# Patient Record
Sex: Female | Born: 1965 | Race: Black or African American | Hispanic: No | Marital: Married | Smoking: Never smoker
Health system: Southern US, Community
[De-identification: ages and names within clinical notes are randomized; demographics above are authoritative.]

## PROBLEM LIST (undated history)

## (undated) ENCOUNTER — Ambulatory Visit: Admission: EM | Payer: BC Managed Care – PPO | Source: Home / Self Care

## (undated) DIAGNOSIS — D649 Anemia, unspecified: Secondary | ICD-10-CM

---

## 1986-12-29 HISTORY — PX: EYE SURGERY: SHX253

## 1986-12-29 HISTORY — PX: SMALL BOWEL REPAIR: SHX6447

## 1986-12-29 HISTORY — PX: SPLENECTOMY: SUR1306

## 2018-10-11 ENCOUNTER — Emergency Department: Payer: BLUE CROSS/BLUE SHIELD

## 2018-10-11 ENCOUNTER — Other Ambulatory Visit: Payer: Self-pay

## 2018-10-11 ENCOUNTER — Encounter: Payer: Self-pay | Admitting: Intensive Care

## 2018-10-11 ENCOUNTER — Emergency Department
Admission: EM | Admit: 2018-10-11 | Discharge: 2018-10-11 | Disposition: A | Discharge status: Home / Self Care | Payer: BLUE CROSS/BLUE SHIELD | Attending: Student in an Organized Health Care Education/Training Program | Admitting: Student in an Organized Health Care Education/Training Program

## 2018-10-11 DIAGNOSIS — R1011 Right upper quadrant pain: Secondary | ICD-10-CM

## 2018-10-11 DIAGNOSIS — K8020 Calculus of gallbladder without cholecystitis without obstruction: Secondary | ICD-10-CM | POA: Diagnosis not present

## 2018-10-11 LAB — CBC
HCT: 40.3 % (ref 36.0–46.0)
HEMOGLOBIN: 13.1 g/dL (ref 12.0–15.0)
MCH: 27.9 pg (ref 26.0–34.0)
MCHC: 32.5 g/dL (ref 30.0–36.0)
MCV: 85.9 fL (ref 80.0–100.0)
Platelets: 410 10*3/uL — ABNORMAL HIGH (ref 150–400)
RBC: 4.69 MIL/uL (ref 3.87–5.11)
RDW: 15.4 % (ref 11.5–15.5)
WBC: 7.1 10*3/uL (ref 4.0–10.5)
nRBC: 0 % (ref 0.0–0.2)

## 2018-10-11 LAB — URINALYSIS, COMPLETE (UACMP) WITH MICROSCOPIC
Bilirubin Urine: NEGATIVE
Glucose, UA: NEGATIVE mg/dL
Ketones, ur: NEGATIVE mg/dL
Leukocytes, UA: NEGATIVE
Nitrite: NEGATIVE
PROTEIN: NEGATIVE mg/dL
Specific Gravity, Urine: 1.012 (ref 1.005–1.030)
pH: 5 (ref 5.0–8.0)

## 2018-10-11 LAB — COMPREHENSIVE METABOLIC PANEL
ALBUMIN: 4 g/dL (ref 3.5–5.0)
ALT: 17 U/L (ref 0–44)
ANION GAP: 11 (ref 5–15)
AST: 16 U/L (ref 15–41)
Alkaline Phosphatase: 79 U/L (ref 38–126)
BUN: 13 mg/dL (ref 6–20)
CHLORIDE: 105 mmol/L (ref 98–111)
CO2: 25 mmol/L (ref 22–32)
Calcium: 8.9 mg/dL (ref 8.9–10.3)
Creatinine, Ser: 0.69 mg/dL (ref 0.44–1.00)
GFR calc Af Amer: >60 mL/min (ref >60)
GFR calc non Af Amer: >60 mL/min (ref >60)
GLUCOSE: 86 mg/dL (ref 70–99)
POTASSIUM: 4.2 mmol/L (ref 3.5–5.1)
SODIUM: 141 mmol/L (ref 135–145)
Total Bilirubin: 0.6 mg/dL (ref 0.3–1.2)
Total Protein: 8 g/dL (ref 6.5–8.1)

## 2018-10-11 LAB — LIPASE, BLOOD: LIPASE: 32 U/L (ref 11–51)

## 2018-10-11 MED ORDER — KETOROLAC TROMETHAMINE 30 MG/ML IJ SOLN
15.0000 mg | Freq: Once | INTRAMUSCULAR | Status: AC
  Administered 2018-10-11: 15 mg via INTRAVENOUS
  Filled 2018-10-11: qty 1

## 2018-10-11 MED ORDER — IOPAMIDOL (ISOVUE-300) INJECTION 61%
100.0000 mL | Freq: Once | INTRAVENOUS | Status: AC | PRN
  Administered 2018-10-11: 100 mL via INTRAVENOUS
  Filled 2018-10-11: qty 100

## 2018-10-11 MED ORDER — PROCHLORPERAZINE MALEATE 10 MG PO TABS: 10.0000 mg | ORAL_TABLET | Freq: Four times a day (QID) | ORAL | 0 refills | Status: AC | PRN

## 2018-10-11 MED ORDER — HYDROCODONE-ACETAMINOPHEN 5-325 MG PO TABS: 1.0000 | ORAL_TABLET | ORAL | 0 refills | Status: DC | PRN
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
## 2018-10-11 NOTE — Discharge Instructions (Signed)

## 2018-10-11 NOTE — ED Triage Notes (Signed)
Patient presents with abd pain that started last night. Tender to touch. Denies N/V/D. Reports pain with ambulation

## 2018-10-11 NOTE — ED Notes (Signed)
Pt given ginger ale and graham crackers for PO challenge at this time
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
## 2018-10-11 NOTE — ED Notes (Signed)
Says right upper abdominal pain since yesterday.  Did not get any better today, so she came to be seen.  Says she tried laxative due to bloating. In nad.

## 2018-10-14 ENCOUNTER — Ambulatory Visit: Payer: Self-pay | Admitting: Surgery
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
## 2018-10-21 ENCOUNTER — Inpatient Hospital Stay: Admission: RE | Admit: 2018-10-21 | Payer: Self-pay | Source: Ambulatory Visit

## 2018-10-22 ENCOUNTER — Inpatient Hospital Stay: Admission: RE | Admit: 2018-10-22 | Payer: Self-pay | Source: Ambulatory Visit

## 2018-10-26 ENCOUNTER — Encounter
Admission: RE | Admit: 2018-10-26 | Discharge: 2018-10-26 | Disposition: A | Discharge status: Home / Self Care | Payer: Self-pay | Source: Ambulatory Visit | Attending: Surgery | Admitting: Surgery

## 2018-10-26 ENCOUNTER — Other Ambulatory Visit: Payer: Self-pay

## 2018-10-26 HISTORY — DX: Anemia, unspecified: D64.9

## 2018-10-26 NOTE — Patient Instructions (Signed)
Your procedure is scheduled on: 10-28-18  Report to Same Day Surgery 2nd floor medical mall [facility] Entrance-take elevator on left to 2nd floor.  Check in with surgery information desk.) To find out your arrival time please call [phone] between 1PM - 3PM on 10-27-18  Remember: Instructions that are not followed completely may result in serious medical risk, up to and including death, or upon the discretion of your surgeon and anesthesiologist your surgery may need to be rescheduled.    _x___ 1. Do not eat food after midnight the night before your procedure. You may drink clear liquids up to 2 hours before you are scheduled to arrive at the hospital for your procedure.  Do not drink clear liquids within 2 hours of your scheduled arrival to the hospital.  Clear liquids include  --Water or Apple juice without pulp  --Clear carbohydrate beverage such as ClearFast or Gatorade  --Black Coffee or Clear Tea (No milk, no creamers, do not add anything to the coffee or Tea   ____Ensure clear carbohydrate drink on the way to the hospital for bariatric patients  ____Ensure clear carbohydrate drink 3 hours before surgery for Dr Rutherford Nail patients if physician instructed.   No gum chewing or hard candies.     __x__ 2. No Alcohol for 24 hours before or after surgery.   __x__3. No Smoking or e-cigarettes for 24 prior to surgery.  Do not use any chewable tobacco products for at least 6 hour prior to surgery   ____  4. Bring all medications with you on the day of surgery if instructed.    __x__ 5. Notify your doctor if there is any change in your medical condition     (cold, fever, infections).    x___6. On the morning of surgery brush your teeth with toothpaste and water.  You may rinse your mouth with mouth wash if you wish.  Do not swallow any toothpaste or mouthwash.   Do not wear jewelry, make-up, hairpins, clips or nail polish.  Do not wear lotions, powders, or perfumes. You may wear  deodorant.  Do not shave 48 hours prior to surgery. Men may shave face and neck.  Do not bring valuables to the hospital.    [facility] is not responsible for any belongings or valuables.               Contacts, dentures or bridgework may not be worn into surgery.  Leave your suitcase in the car. After surgery it may be brought to your room.  For patients admitted to the hospital, discharge time is determined by your treatment team.  _  Patients discharged the day of surgery will not be allowed to drive home.  You will need someone to drive you home and stay with you the night of your procedure.    Please read over the following fact sheets that you were given:   [facility] Preparing for Surgery   ____ Take anti-hypertensive listed below, cardiac, seizure, asthma, anti-reflux and psychiatric medicines. These include:  1. NONE  2.  3.  4.  5.  6.  ____Fleets enema or Magnesium Citrate as directed.   ____ Use CHG Soap or sage wipes as directed on instruction sheet   ____ Use inhalers on the day of surgery and bring to hospital day of surgery  ____ Stop Metformin and Janumet 2 days prior to surgery.    ____ Take 1/2 of usual insulin dose the night before surgery and none  on the morning surgery.   ____ Follow recommendations from Cardiologist, Pulmonologist or PCP regarding stopping Aspirin, Coumadin, Plavix ,Eliquis, Effient, or Pradaxa, and Pletal.  X____Stop Anti-inflammatories such as Advil, Aleve, Ibuprofen, Motrin, Naproxen, Naprosyn, Goodies powders or aspirin products NOW-OK to take Tylenol    ____ Stop supplements until after surgery.     ____ Bring C-Pap to the hospital.

## 2018-10-27 MED ORDER — CEFAZOLIN SODIUM-DEXTROSE 2-4 GM/100ML-% IV SOLN
2.0000 g | INTRAVENOUS | Status: AC
  Administered 2018-10-28: 2 g via INTRAVENOUS

## 2018-10-28 ENCOUNTER — Ambulatory Visit
Admission: RE | Admit: 2018-10-28 | Discharge: 2018-10-28 | Disposition: A | Discharge status: Home / Self Care | Payer: BLUE CROSS/BLUE SHIELD | Source: Ambulatory Visit | Attending: Surgery | Admitting: Surgery

## 2018-10-28 ENCOUNTER — Encounter
Admission: RE | Disposition: A | Discharge status: Home / Self Care | Payer: Self-pay | Source: Ambulatory Visit | Attending: Surgery

## 2018-10-28 ENCOUNTER — Ambulatory Visit: Payer: BLUE CROSS/BLUE SHIELD | Admitting: Certified Registered"

## 2018-10-28 ENCOUNTER — Encounter: Payer: Self-pay | Admitting: *Deleted

## 2018-10-28 DIAGNOSIS — Z9081 Acquired absence of spleen: Secondary | ICD-10-CM | POA: Diagnosis not present

## 2018-10-28 DIAGNOSIS — K760 Fatty (change of) liver, not elsewhere classified: Secondary | ICD-10-CM | POA: Insufficient documentation

## 2018-10-28 DIAGNOSIS — K8010 Calculus of gallbladder with chronic cholecystitis without obstruction: Secondary | ICD-10-CM | POA: Insufficient documentation

## 2018-10-28 DIAGNOSIS — K429 Umbilical hernia without obstruction or gangrene: Secondary | ICD-10-CM | POA: Diagnosis not present

## 2018-10-28 DIAGNOSIS — K810 Acute cholecystitis: Secondary | ICD-10-CM

## 2018-10-28 HISTORY — PX: CHOLECYSTECTOMY: SHX55

## 2018-10-28 SURGERY — LAPAROSCOPIC CHOLECYSTECTOMY
Anesthesia: General

## 2018-10-28 MED ORDER — CEFAZOLIN SODIUM-DEXTROSE 2-4 GM/100ML-% IV SOLN
INTRAVENOUS | Status: AC
  Filled 2018-10-28: qty 100

## 2018-10-28 MED ORDER — IBUPROFEN 800 MG PO TABS: 800.0000 mg | ORAL_TABLET | Freq: Three times a day (TID) | ORAL | 0 refills | Status: AC | PRN

## 2018-10-28 MED ORDER — ACETAMINOPHEN 500 MG PO TABS
ORAL_TABLET | ORAL | Status: AC
  Administered 2018-10-28: 1000 mg via ORAL
  Filled 2018-10-28: qty 2

## 2018-10-28 MED ORDER — MIDAZOLAM HCL 2 MG/2ML IJ SOLN
INTRAMUSCULAR | Status: AC
  Filled 2018-10-28: qty 2

## 2018-10-28 MED ORDER — DEXAMETHASONE SODIUM PHOSPHATE 10 MG/ML IJ SOLN
INTRAMUSCULAR | Status: AC
  Filled 2018-10-28: qty 1

## 2018-10-28 MED ORDER — SUGAMMADEX SODIUM 200 MG/2ML IV SOLN
INTRAVENOUS | Status: DC | PRN
  Administered 2018-10-28: 165 mg via INTRAVENOUS

## 2018-10-28 MED ORDER — BUPIVACAINE HCL (PF) 0.5 % IJ SOLN
INTRAMUSCULAR | Status: AC
  Filled 2018-10-28: qty 30

## 2018-10-28 MED ORDER — LIDOCAINE-EPINEPHRINE (PF) 1 %-1:200000 IJ SOLN
INTRAMUSCULAR | Status: DC | PRN
  Administered 2018-10-28: 28 mL

## 2018-10-28 MED ORDER — PROPOFOL 10 MG/ML IV BOLUS
INTRAVENOUS | Status: AC
  Filled 2018-10-28: qty 20

## 2018-10-28 MED ORDER — FAMOTIDINE 20 MG PO TABS
ORAL_TABLET | ORAL | Status: AC
  Administered 2018-10-28: 20 mg via ORAL
  Filled 2018-10-28: qty 1

## 2018-10-28 MED ORDER — CELECOXIB 200 MG PO CAPS
200.0000 mg | ORAL_CAPSULE | ORAL | Status: AC
  Administered 2018-10-28: 200 mg via ORAL

## 2018-10-28 MED ORDER — ROCURONIUM BROMIDE 50 MG/5ML IV SOLN
INTRAVENOUS | Status: AC
  Filled 2018-10-28: qty 1

## 2018-10-28 MED ORDER — SEVOFLURANE IN SOLN
RESPIRATORY_TRACT | Status: AC
  Filled 2018-10-28: qty 250

## 2018-10-28 MED ORDER — PROPOFOL 10 MG/ML IV BOLUS
INTRAVENOUS | Status: DC | PRN
  Administered 2018-10-28: 160 mg via INTRAVENOUS

## 2018-10-28 MED ORDER — FENTANYL CITRATE (PF) 100 MCG/2ML IJ SOLN
INTRAMUSCULAR | Status: AC
  Filled 2018-10-28: qty 2

## 2018-10-28 MED ORDER — LIDOCAINE HCL (PF) 2 % IJ SOLN
INTRAMUSCULAR | Status: AC
  Filled 2018-10-28: qty 10

## 2018-10-28 MED ORDER — CELECOXIB 200 MG PO CAPS
ORAL_CAPSULE | ORAL | Status: AC
  Administered 2018-10-28: 200 mg via ORAL
  Filled 2018-10-28: qty 1

## 2018-10-28 MED ORDER — FAMOTIDINE 20 MG PO TABS
20.0000 mg | ORAL_TABLET | Freq: Once | ORAL | Status: AC
  Administered 2018-10-28: 20 mg via ORAL

## 2018-10-28 MED ORDER — GABAPENTIN 300 MG PO CAPS
ORAL_CAPSULE | ORAL | Status: AC
  Administered 2018-10-28: 300 mg via ORAL
  Filled 2018-10-28: qty 1

## 2018-10-28 MED ORDER — FENTANYL CITRATE (PF) 100 MCG/2ML IJ SOLN
INTRAMUSCULAR | Status: DC | PRN
  Administered 2018-10-28: 50 ug via INTRAVENOUS
  Administered 2018-10-28: 100 ug via INTRAVENOUS

## 2018-10-28 MED ORDER — CHLORHEXIDINE GLUCONATE CLOTH 2 % EX PADS
6.0000 | MEDICATED_PAD | Freq: Once | CUTANEOUS | Status: AC
  Administered 2018-10-28: 6 via TOPICAL

## 2018-10-28 MED ORDER — FENTANYL CITRATE (PF) 100 MCG/2ML IJ SOLN
25.0000 ug | INTRAMUSCULAR | Status: DC | PRN
  Administered 2018-10-28 (×4): 25 ug via INTRAVENOUS

## 2018-10-28 MED ORDER — LIDOCAINE HCL (CARDIAC) PF 100 MG/5ML IV SOSY
PREFILLED_SYRINGE | INTRAVENOUS | Status: DC | PRN
  Administered 2018-10-28: 100 mg via INTRAVENOUS

## 2018-10-28 MED ORDER — HYDROCODONE-ACETAMINOPHEN 5-325 MG PO TABS
ORAL_TABLET | ORAL | Status: AC
  Filled 2018-10-28: qty 1

## 2018-10-28 MED ORDER — ONDANSETRON HCL 4 MG/2ML IJ SOLN
INTRAMUSCULAR | Status: AC
  Filled 2018-10-28: qty 2

## 2018-10-28 MED ORDER — LACTATED RINGERS IV SOLN
INTRAVENOUS | Status: DC
  Administered 2018-10-28: 50 mL/h via INTRAVENOUS

## 2018-10-28 MED ORDER — ONDANSETRON HCL 4 MG/2ML IJ SOLN
INTRAMUSCULAR | Status: DC | PRN
  Administered 2018-10-28: 4 mg via INTRAVENOUS

## 2018-10-28 MED ORDER — PHENYLEPHRINE HCL 10 MG/ML IJ SOLN
INTRAMUSCULAR | Status: AC
  Filled 2018-10-28: qty 1

## 2018-10-28 MED ORDER — PHENYLEPHRINE HCL 10 MG/ML IJ SOLN
INTRAMUSCULAR | Status: DC | PRN
  Administered 2018-10-28 (×4): 100 ug via INTRAVENOUS

## 2018-10-28 MED ORDER — APAP 325 MG PO TABS: 625.0000 mg | ORAL_TABLET | Freq: Three times a day (TID) | ORAL | 0 refills | Status: AC | PRN

## 2018-10-28 MED ORDER — DEXAMETHASONE SODIUM PHOSPHATE 10 MG/ML IJ SOLN
INTRAMUSCULAR | Status: DC | PRN
  Administered 2018-10-28: 8 mg via INTRAVENOUS

## 2018-10-28 MED ORDER — GABAPENTIN 300 MG PO CAPS
300.0000 mg | ORAL_CAPSULE | ORAL | Status: AC
  Administered 2018-10-28: 300 mg via ORAL

## 2018-10-28 MED ORDER — HYDROCODONE-ACETAMINOPHEN 5-325 MG PO TABS
1.0000 | ORAL_TABLET | Freq: Once | ORAL | Status: AC
  Administered 2018-10-28: 1 via ORAL

## 2018-10-28 MED ORDER — DOCUSATE SODIUM 100 MG PO CAPS: 100.0000 mg | ORAL_CAPSULE | Freq: Two times a day (BID) | ORAL | 0 refills | Status: AC | PRN

## 2018-10-28 MED ORDER — SUGAMMADEX SODIUM 200 MG/2ML IV SOLN
INTRAVENOUS | Status: AC
  Filled 2018-10-28: qty 2

## 2018-10-28 MED ORDER — HYDROCODONE-ACETAMINOPHEN 5-325 MG PO TABS: 1.0000 | ORAL_TABLET | ORAL | 0 refills | Status: AC | PRN

## 2018-10-28 MED ORDER — ACETAMINOPHEN 500 MG PO TABS
1000.0000 mg | ORAL_TABLET | ORAL | Status: AC
  Administered 2018-10-28: 1000 mg via ORAL

## 2018-10-28 MED ORDER — ROCURONIUM BROMIDE 100 MG/10ML IV SOLN
INTRAVENOUS | Status: DC | PRN
  Administered 2018-10-28: 10 mg via INTRAVENOUS
  Administered 2018-10-28: 50 mg via INTRAVENOUS

## 2018-10-28 MED ORDER — ONDANSETRON HCL 4 MG/2ML IJ SOLN: 4.0000 mg | Freq: Once | INTRAMUSCULAR | Status: DC | PRN

## 2018-10-28 MED ORDER — FENTANYL CITRATE (PF) 100 MCG/2ML IJ SOLN
INTRAMUSCULAR | Status: AC
  Administered 2018-10-28: 25 ug via INTRAVENOUS
  Filled 2018-10-28: qty 2

## 2018-10-28 MED ORDER — MIDAZOLAM HCL 2 MG/2ML IJ SOLN
INTRAMUSCULAR | Status: DC | PRN
  Administered 2018-10-28: 2 mg via INTRAVENOUS

## 2018-10-28 MED ORDER — LIDOCAINE-EPINEPHRINE (PF) 1 %-1:200000 IJ SOLN
INTRAMUSCULAR | Status: AC
  Filled 2018-10-28: qty 30

## 2018-10-28 SURGICAL SUPPLY — 57 items
APPLICATOR COTTON TIP 6 STRL (MISCELLANEOUS) ×1 IMPLANT
APPLICATOR COTTON TIP 6IN STRL (MISCELLANEOUS) ×3
APPLIER CLIP 5 13 M/L LIGAMAX5 (MISCELLANEOUS) ×3
BLADE SURG SZ11 CARB STEEL (BLADE) ×3 IMPLANT
CANISTER SUCT 1200ML W/VALVE (MISCELLANEOUS) ×3 IMPLANT
CHLORAPREP W/TINT 26ML (MISCELLANEOUS) ×3 IMPLANT
CHOLANGIOGRAM CATH TAUT (CATHETERS) IMPLANT
CLIP APPLIE 5 13 M/L LIGAMAX5 (MISCELLANEOUS) ×1 IMPLANT
COVER WAND RF STERILE (DRAPES) ×3 IMPLANT
DECANTER SPIKE VIAL GLASS SM (MISCELLANEOUS) ×6 IMPLANT
DEFOGGER SCOPE WARMER CLEARIFY (MISCELLANEOUS) IMPLANT
DERMABOND ADVANCED (GAUZE/BANDAGES/DRESSINGS) ×2
DERMABOND ADVANCED .7 DNX12 (GAUZE/BANDAGES/DRESSINGS) ×1 IMPLANT
DISSECTOR BLUNT TIP ENDO 5MM (MISCELLANEOUS) IMPLANT
DISSECTOR KITTNER STICK (MISCELLANEOUS) IMPLANT
DISSECTORS/KITTNER STICK (MISCELLANEOUS)
DRAPE C-ARM XRAY 36X54 (DRAPES) IMPLANT
DRAPE SHEET LG 3/4 BI-LAMINATE (DRAPES) IMPLANT
ELECT CAUTERY BLADE 6.4 (BLADE) ×3 IMPLANT
ELECT REM PT RETURN 9FT ADLT (ELECTROSURGICAL) ×3
ELECTRODE REM PT RTRN 9FT ADLT (ELECTROSURGICAL) ×1 IMPLANT
GLOVE BIOGEL PI IND STRL 7.0 (GLOVE) ×3 IMPLANT
GLOVE BIOGEL PI INDICATOR 7.0 (GLOVE) ×6
GLOVE SURG SYN 7.0 (GLOVE) ×9 IMPLANT
GOWN STRL REUS W/ TWL LRG LVL3 (GOWN DISPOSABLE) ×3 IMPLANT
GOWN STRL REUS W/TWL LRG LVL3 (GOWN DISPOSABLE) ×6
GRASPER SUT TROCAR 14GX15 (MISCELLANEOUS) IMPLANT
HEMOSTAT SURGICEL 2X3 (HEMOSTASIS) ×3 IMPLANT
IRRIGATION STRYKERFLOW (MISCELLANEOUS) ×1 IMPLANT
IRRIGATOR STRYKERFLOW (MISCELLANEOUS) ×3
IV CATH ANGIO 12GX3 LT BLUE (NEEDLE) IMPLANT
IV NS 1000ML (IV SOLUTION) ×2
IV NS 1000ML BAXH (IV SOLUTION) ×1 IMPLANT
JACKSON PRATT 10 (INSTRUMENTS) IMPLANT
L-HOOK LAP DISP 36CM (ELECTROSURGICAL) ×3
LHOOK LAP DISP 36CM (ELECTROSURGICAL) ×1 IMPLANT
NEEDLE HYPO 22GX1.5 SAFETY (NEEDLE) ×3 IMPLANT
PACK LAP CHOLECYSTECTOMY (MISCELLANEOUS) ×3 IMPLANT
PENCIL ELECTRO HAND CTR (MISCELLANEOUS) ×3 IMPLANT
PORT ACCESS TROCAR AIRSEAL 5 (TROCAR) ×3 IMPLANT
POUCH SPECIMEN RETRIEVAL 10MM (ENDOMECHANICALS) ×3 IMPLANT
SCISSORS METZENBAUM CVD 33 (INSTRUMENTS) ×3 IMPLANT
SET TRI-LUMEN FLTR TB AIRSEAL (TUBING) ×3 IMPLANT
SLEEVE ENDOPATH XCEL 5M (ENDOMECHANICALS) ×6 IMPLANT
SPONGE LAP 18X18 RF (DISPOSABLE) IMPLANT
STOPCOCK 4 WAY LG BORE MALE ST (IV SETS) IMPLANT
SUT MNCRL 4-0 (SUTURE) ×2
SUT MNCRL 4-0 27XMFL (SUTURE) ×1
SUT VIC AB 3-0 SH 27 (SUTURE) ×2
SUT VIC AB 3-0 SH 27X BRD (SUTURE) ×1 IMPLANT
SUT VICRYL 0 AB UR-6 (SUTURE) ×9 IMPLANT
SUTURE MNCRL 4-0 27XMF (SUTURE) ×1 IMPLANT
SYR 20CC LL (SYRINGE) ×3 IMPLANT
TOWEL OR 17X26 4PK STRL BLUE (TOWEL DISPOSABLE) ×3 IMPLANT
TROCAR XCEL BLUNT TIP 100MML (ENDOMECHANICALS) ×3 IMPLANT
TROCAR XCEL NON-BLD 5MMX100MML (ENDOMECHANICALS) ×3 IMPLANT
WATER STERILE IRR 1000ML POUR (IV SOLUTION) ×3 IMPLANT

## 2018-10-28 NOTE — Transfer of Care (Signed)
Immediate Anesthesia Transfer of Care Note  Patient: Kaitlyn Stokes  Procedure(s) Performed: LAPAROSCOPIC CHOLECYSTECTOMY (N/A )  Patient Location: PACU  Anesthesia Type:General  Level of Consciousness: drowsy and responds to stimulation  Airway & Oxygen Therapy: Patient Spontanous Breathing and Patient connected to face mask oxygen  Post-op Assessment: Report given to RN and Post -op Vital signs reviewed and stable  Post vital signs: Reviewed and stable  Last Vitals:  Vitals Value Taken Time  BP 113/74 10/28/2018  9:31 AM  Temp    Pulse 81 10/28/2018  9:31 AM  Resp 14 10/28/2018  9:31 AM  SpO2 100 % 10/28/2018  9:31 AM  Vitals shown include unvalidated device data.  Last Pain:  Vitals:   10/28/18 0619  TempSrc: Oral  PainSc: 1       Patients Stated Pain Goal: 0 (10/28/18 1610)  Complications: No apparent anesthesia complications

## 2018-10-28 NOTE — Anesthesia Procedure Notes (Signed)
Procedure Name: Intubation Performed by: Lance Muss, CRNA Pre-anesthesia Checklist: Patient identified, Patient being monitored, Timeout performed, Emergency Drugs available and Suction available Patient Re-evaluated:Patient Re-evaluated prior to induction Oxygen Delivery Method: Circle system utilized Preoxygenation: Pre-oxygenation with 100% oxygen Induction Type: IV induction Ventilation: Mask ventilation without difficulty Laryngoscope Size: Mac and 3 Grade View: Grade I Tube type: Oral Tube size: 7.0 mm Number of attempts: 1 Airway Equipment and Method: Stylet Placement Confirmation: ETT inserted through vocal cords under direct vision,  positive ETCO2 and breath sounds checked- equal and bilateral Secured at: 22 cm Tube secured with: Tape Dental Injury: Teeth and Oropharynx as per pre-operative assessment

## 2018-10-28 NOTE — Op Note (Signed)
Preoperative diagnosis:  acute and cholecystitis,   Postoperative diagnosis: same as above  Procedure: Laparoscopic Cholecystectomy. Open primary umbilical hernia repair  Anesthesia: GETA   Surgeon: Sung Amabile  Specimen: Gallbladder  Complications: None  EBL: 20mL  Wound Classification: Clean Contaminated  Indications: see HPI  Findings: Critical view of safety noted Cystic duct and artery identified, ligated and divided, clips remained intact at end of procedure Adequate hemostasis  Description of procedure: The patient was placed on the operating table in the supine position. SCDs placed, pre-op abx administered.  General anesthesia was induced and OG tube placed by anesthesia. A time-out was completed verifying correct patient, procedure, site, positioning, and implant(s) and/or special equipment prior to beginning this procedure. The abdomen was prepped and draped in the usual sterile fashion.  An incision was made in the former incision site lateral the umbilicus.  Dissection carried down to fascia where two 0 vicryl sutures placed to use as anchor sutures for hasson port.  Incision made into previously noted umbilical hernia and sac removed completely and contents reduced.  Through the defect, Hasson port placed and insufflation started up to 15mm Hg without any dramatic increase in pressure.    The laparoscope was inserted and the abdomen inspected. No injuries from initial trocar placement were noted. No adhesions noted blocking operative view.  Additional trocars were then inserted under direct visualization in the following locations: a 5-mm trocar in the subxyphoid region and two 5-mm trocars along the right costal margin. The abdomen was inspected and no abnormalities or injuries were found. The table was placed in the reverse Trendelenburg position with the right side up.  Filmy adhesions between the gallbladder and omentum, duodenum and transverse colon were lysed sharply.  The dome of the gallbladder was grasped with an atraumatic grasper passed through the lateral port and retracted over the dome of the liver. The infundibulum was also grasped with an atraumatic grasper and retracted toward the right lower quadrant. This maneuver exposed Calot's triangle. The peritoneum overlying the gallbladder infundibulum was then dissected and the cystic duct and cystic artery identified.  Critical view of safety with the liver bed clearly visible behind the duct and artery with no additional structures noted.  Cystic duct and cystic artery clipped and divided close to the gallbladder.  The gallbladder was then dissected from its peritoneal and liver bed attachments by electrocautery. Surgicell placed in friable liver bed.  Hemostasis was checked and the gallbladder was removed using an endoscopic retrieval bag placed through the umbilical port. The gallbladder was passed off the table as a specimen. The gallbladder fossa was copiously irrigated with saline suctioned out, and hemostasis was obtained. There was no evidence of bleeding from the gallbladder fossa or cystic artery or leakage of the bile from the cystic duct stump. Abdomen desufflated and secondary trocars were removed under direct vision. No bleeding was noted. The laparoscope was withdrawn and the umbilical trocar removed.  The fascia of the Hasson trocar/umbilical hernia site was closed with running 0 vicryl sutures x2 under minimal tension.  3-0 vicryl used to close deep dermal layer at umbilical site after umbilical stalk re-attached to fascia.  All skin incisions then closed with subcuticular sutures of 4-0 monocryl and dressed with topical skin adhesive. The orogastric tube was removed and patient extubated. The patient tolerated the procedure well and was taken to the postanesthesia care unit in stable condition.  All sponge and instrument count correct at end of procedure.

## 2018-10-28 NOTE — Anesthesia Preprocedure Evaluation (Signed)
Anesthesia Evaluation  Patient identified by MRN, date of birth, ID band Patient awake    Reviewed: Allergy & Precautions, NPO status , Patient's Chart, lab work & pertinent test results  Airway Mallampati: II       Dental no notable dental hx.    Pulmonary neg pulmonary ROS,    Pulmonary exam normal        Cardiovascular negative cardio ROS Normal cardiovascular exam     Neuro/Psych negative neurological ROS  negative psych ROS   GI/Hepatic Neg liver ROS,   Endo/Other  negative endocrine ROS  Renal/GU negative Renal ROS  negative genitourinary   Musculoskeletal   Abdominal Normal abdominal exam  (+)   Peds negative pediatric ROS (+)  Hematology  (+) anemia ,   Anesthesia Other Findings   Reproductive/Obstetrics                             Anesthesia Physical Anesthesia Plan  ASA: II  Anesthesia Plan: General   Post-op Pain Management:    Induction: Intravenous  PONV Risk Score and Plan:   Airway Management Planned: Oral ETT  Additional Equipment:   Intra-op Plan:   Post-operative Plan: Extubation in OR  Informed Consent: I have reviewed the patients History and Physical, chart, labs and discussed the procedure including the risks, benefits and alternatives for the proposed anesthesia with the patient or authorized representative who has indicated his/her understanding and acceptance.   Dental advisory given  Plan Discussed with: CRNA and Surgeon  Anesthesia Plan Comments:         Anesthesia Quick Evaluation

## 2018-10-28 NOTE — Interval H&P Note (Signed)
History and Physical Interval Note:  10/28/2018 7:07 AM  Kaitlyn Stokes  has presented today for surgery, with the diagnosis of BILIARY COLIC  The various methods of treatment have been discussed with the patient and family. After consideration of risks, benefits and other options for treatment, the patient has consented to  Procedure(s): LAPAROSCOPIC CHOLECYSTECTOMY (N/A) as a surgical intervention .  The patient's history has been reviewed, patient examined, no change in status, stable for surgery.  I have reviewed the patient's chart and labs.  Questions were answered to the patient's satisfaction.     Leibish Mcgregor Tonna Boehringer

## 2018-10-28 NOTE — Anesthesia Post-op Follow-up Note (Signed)
Anesthesia QCDR form completed.        

## 2018-10-28 NOTE — Discharge Instructions (Signed)

## 2018-10-30 NOTE — Anesthesia Postprocedure Evaluation (Signed)
Anesthesia Post Note  Patient: Kaitlyn Stokes  Procedure(s) Performed: LAPAROSCOPIC CHOLECYSTECTOMY (N/A )  Patient location during evaluation: PACU Anesthesia Type: General Level of consciousness: awake and alert and oriented Pain management: pain level controlled Vital Signs Assessment: post-procedure vital signs reviewed and stable Respiratory status: spontaneous breathing Cardiovascular status: blood pressure returned to baseline Anesthetic complications: no     Last Vitals:  Vitals:   10/28/18 1032 10/28/18 1147  BP: 119/79 108/66  Pulse:  83  Resp: 16 16  Temp: (!) 36.2 C   SpO2: 94% 97%    Last Pain:  Vitals:   10/28/18 1200  TempSrc:   PainSc: 3                  Manisha Cancel

## 2018-11-01 LAB — SURGICAL PATHOLOGY

## 2019-04-05 IMAGING — CT CT ABD-PELV W/ CM
2 of 5 series · 15 of 46 positions shown, 17 images · IV contrast (APPLIED)
Comparison: None.

CLINICAL DATA: Right upper quadrant abdominal pain and tenderness.
Splenectomy.

EXAM:
CT ABDOMEN AND PELVIS WITH CONTRAST
TECHNIQUE: Multidetector CT imaging of the abdomen and pelvis was performed
using the standard protocol following bolus administration of
intravenous contrast.
CONTRAST:  100mL [id number] IOPAMIDOL ([id number]) INJECTION 61%

[Series 2: routine abd/pel with · axial · 0.68mm/px · z∈[-390,+20]mm · 12 of 94 slices shown, 14 images]
[im 6/94  soft-tissue]
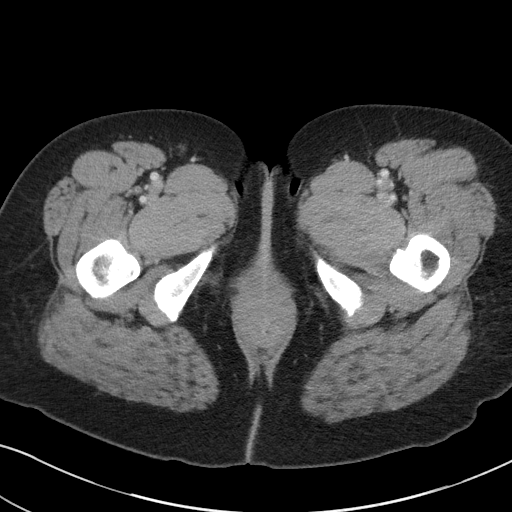
[im 6/94  bone]
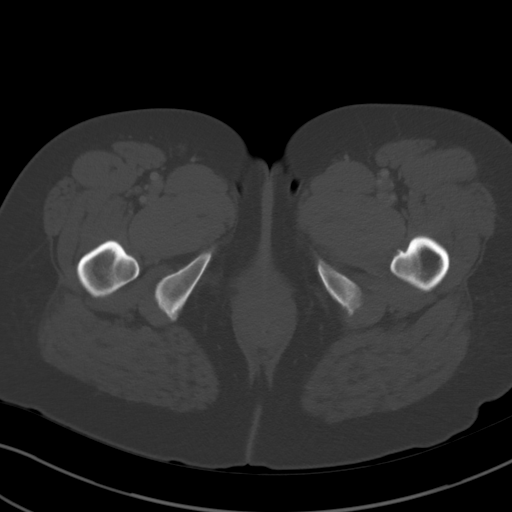
[im 16/94  soft-tissue]
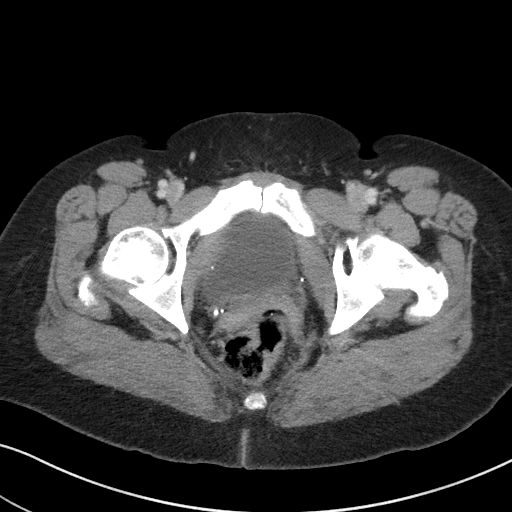
[im 21/94  soft-tissue]
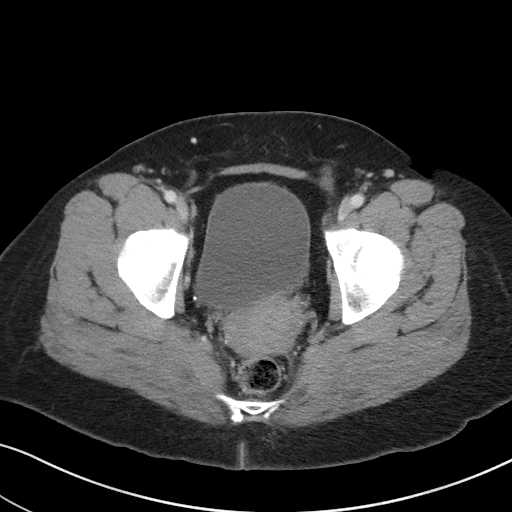
[im 26/94  soft-tissue]
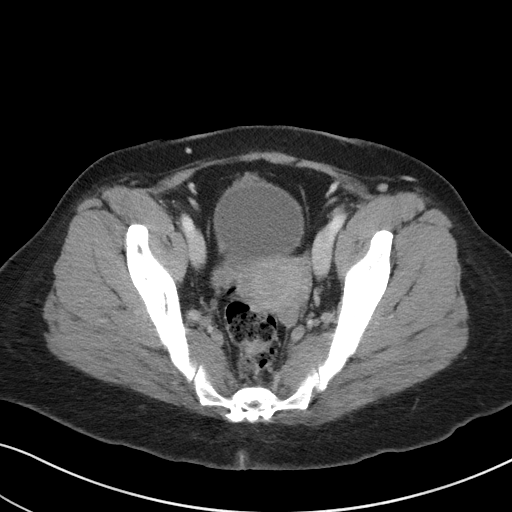
[im 37/94  soft-tissue]
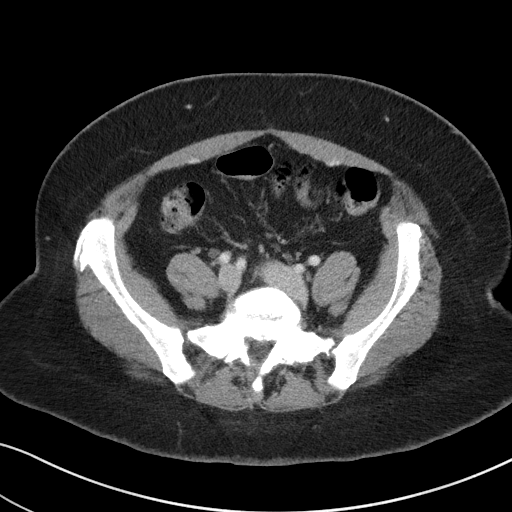
[im 42/94  soft-tissue]
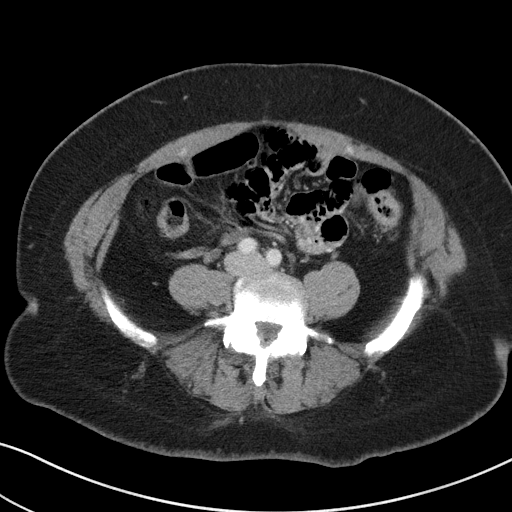
[im 52/94  soft-tissue]
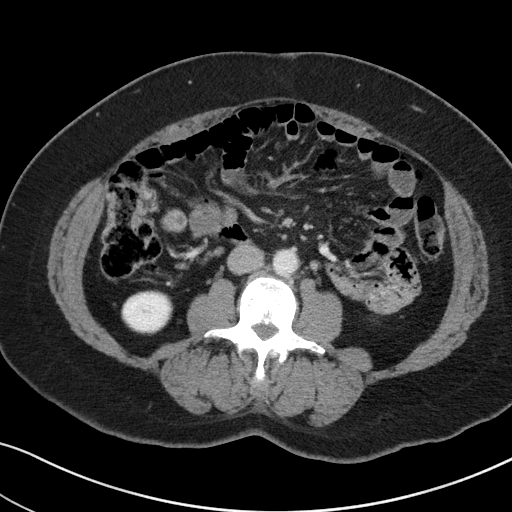
[im 57/94  soft-tissue]
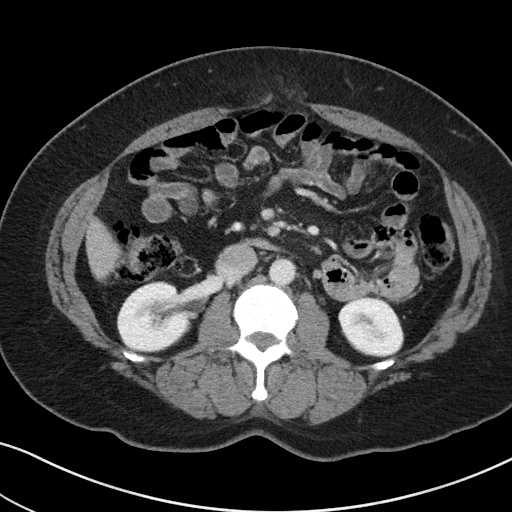
[im 68/94  soft-tissue]
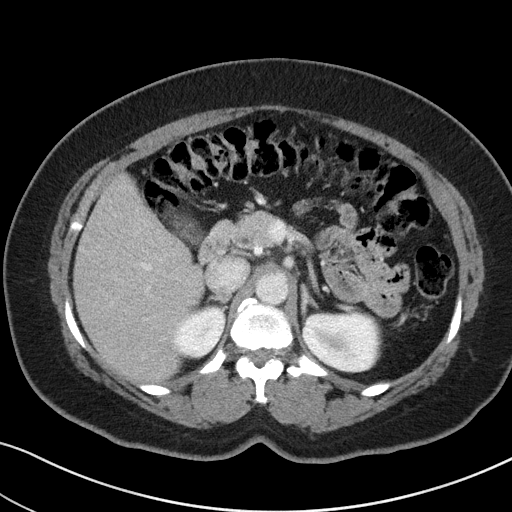
[im 68/94  bone]
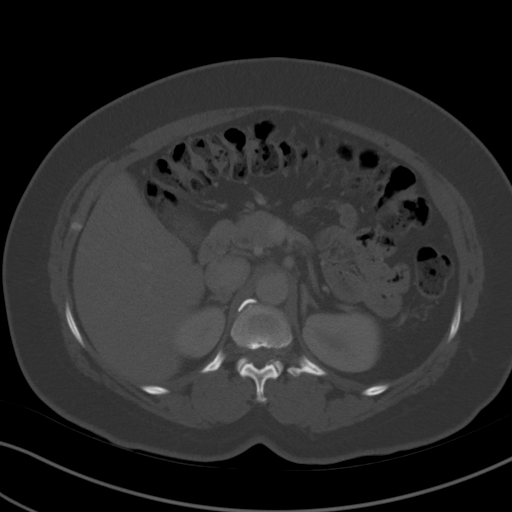
[im 73/94  soft-tissue]
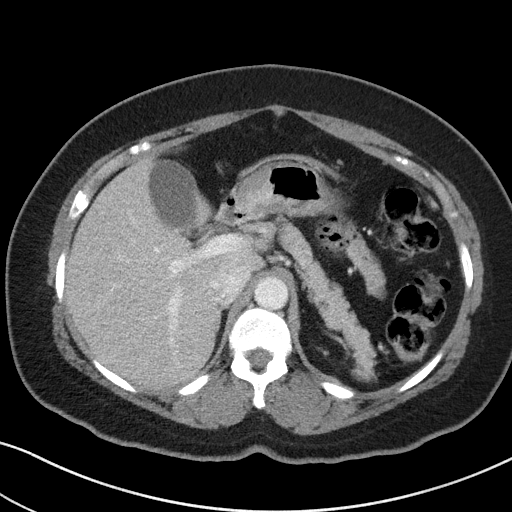
[im 78/94  soft-tissue]
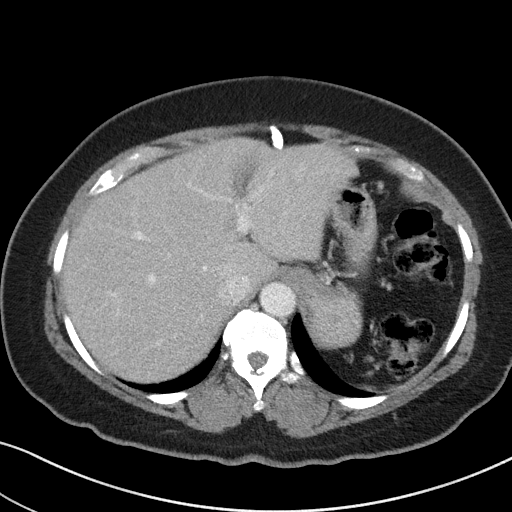
[im 88/94  soft-tissue]
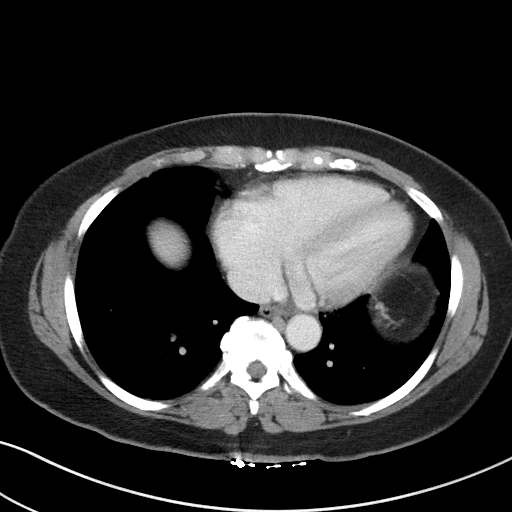

[Series 5: coronal st · coronal · 0.70mm/px · 3 of 78 slices shown]
[im 26/78  soft-tissue]
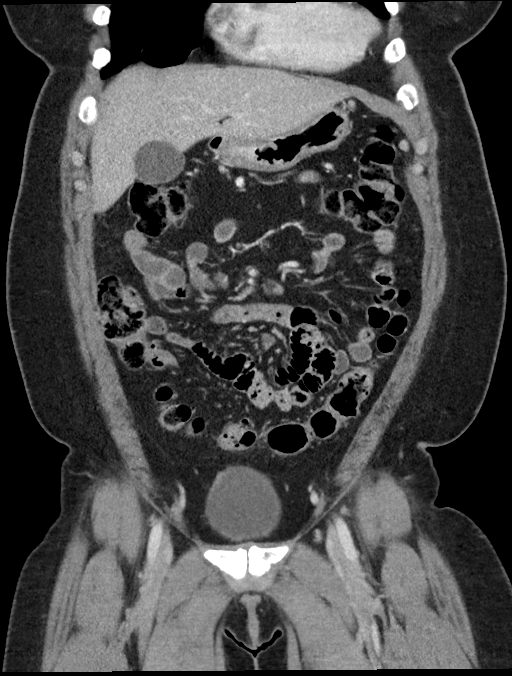
[im 35/78  soft-tissue]
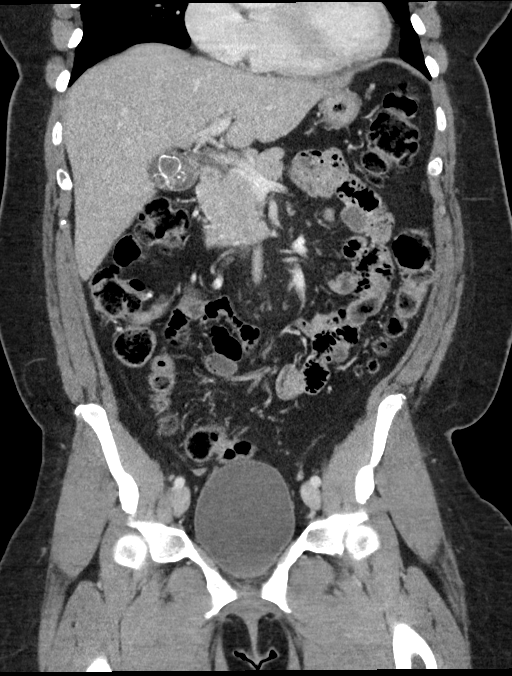
[im 43/78  soft-tissue]
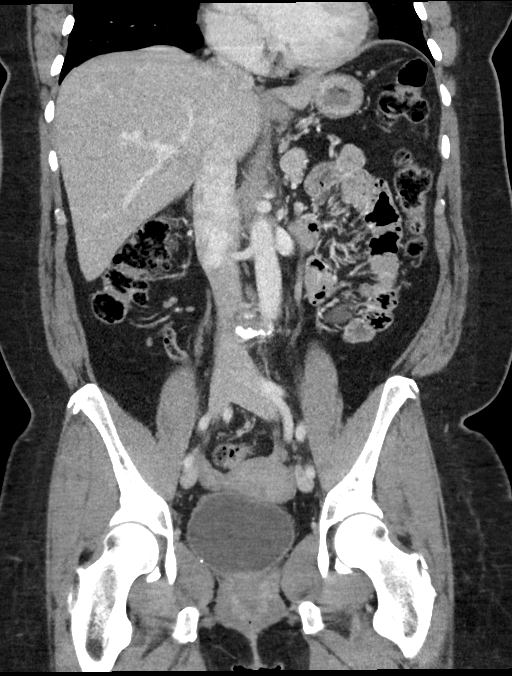

[15 of 46 positions shown; findings below may reference images not displayed]

FINDINGS: Lower chest: Negative.

Hepatobiliary: No focal lesions. Slight hepatic steatosis. Multiple
gallstones. No gallbladder wall thickening or biliary ductal
dilatation.

Pancreas: Unremarkable. No pancreatic ductal dilatation or
surrounding inflammatory changes.

Spleen: Previous splenectomy. Tiny accessory spleens seen under the
left hemidiaphragm.

Adrenals/Urinary Tract: Normal adrenal glands. 17 mm cyst in the mid
right kidney. 5 mm cyst in the upper pole of the left kidney. No
hydronephrosis. Normal bladder.

Stomach/Bowel: Stomach is within normal limits. Appendix appears
normal. No evidence of bowel wall thickening, distention, or
inflammatory changes.

Vascular/Lymphatic: No significant vascular findings are present. No
enlarged abdominal or pelvic lymph nodes.

Reproductive: Uterus and bilateral adnexa are unremarkable.

Other: There is a 10 mm enhancing intraperitoneal nodule deep to the
right rectus muscle visible on image 33 of series 2. This is
indeterminate but if the patient had previous splenic trauma prior
to splenectomy, this could represent an ectopic splenic remnant.

The patient has a midline abdominal wall hernia containing only a
small amount of fat just above the umbilicus. The defect in the
anterior abdominal wall is approximately 2.7 cm in diameter.

Musculoskeletal: No acute abnormality. Calcified broad-based disc
protrusion at L3-4 creates moderately severe spinal stenosis. Severe
left foraminal stenosis at L5-S1 with moderately severe right
foraminal stenosis at L5-S1.
IMPRESSION: 1. Cholelithiasis without findings suggestive of cholecystitis.
2. Mild hepatic steatosis.
3. Previous splenectomy with small accessory spleens under the left
hemidiaphragm and possibly deep to the right rectus muscle as
described above.
4. Midline anterior abdominal wall incisional hernia containing only
fat.

## 2022-02-21 ENCOUNTER — Ambulatory Visit: Payer: Self-pay | Admitting: Family Medicine
